# Patient Record
Sex: Female | Born: 1995 | Race: Black or African American | Hispanic: No | Marital: Single | Smoking: Former smoker
Health system: Southern US, Community
[De-identification: ages and names within clinical notes are randomized; demographics above are authoritative.]

---

## 2016-03-27 ENCOUNTER — Encounter (HOSPITAL_COMMUNITY): Payer: Self-pay | Admitting: Emergency Medicine

## 2016-03-27 ENCOUNTER — Ambulatory Visit (INDEPENDENT_AMBULATORY_CARE_PROVIDER_SITE_OTHER): Payer: Self-pay

## 2016-03-27 ENCOUNTER — Ambulatory Visit (HOSPITAL_COMMUNITY)
Admission: EM | Admit: 2016-03-27 | Discharge: 2016-03-27 | Disposition: A | Discharge status: Home / Self Care | Payer: Self-pay | Attending: Emergency Medicine | Admitting: Emergency Medicine

## 2016-03-27 DIAGNOSIS — S46911A Strain of unspecified muscle, fascia and tendon at shoulder and upper arm level, right arm, initial encounter: Secondary | ICD-10-CM

## 2016-03-27 DIAGNOSIS — S46001A Unspecified injury of muscle(s) and tendon(s) of the rotator cuff of right shoulder, initial encounter: Secondary | ICD-10-CM

## 2016-03-27 MED ORDER — TRAMADOL HCL 50 MG PO TABS: ORAL_TABLET | ORAL | 0 refills | Status: AC

## 2016-03-27 MED ORDER — PREDNISONE 10 MG (21) PO TBPK: ORAL_TABLET | ORAL | 0 refills | Status: AC

## 2016-03-27 MED ORDER — IBUPROFEN 800 MG PO TABS: 800.0000 mg | ORAL_TABLET | Freq: Three times a day (TID) | ORAL | 0 refills | Status: AC | PRN
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
## 2017-10-04 IMAGING — DX DG SHOULDER 2+V*L*
3 series · 3 of 3 positions shown · non-contrast
Comparison: None.

CLINICAL DATA: Left shoulder pain for 3 days after carrying heavy
trays

EXAM:
LEFT SHOULDER - 2+ VIEW

[shoulder ap]
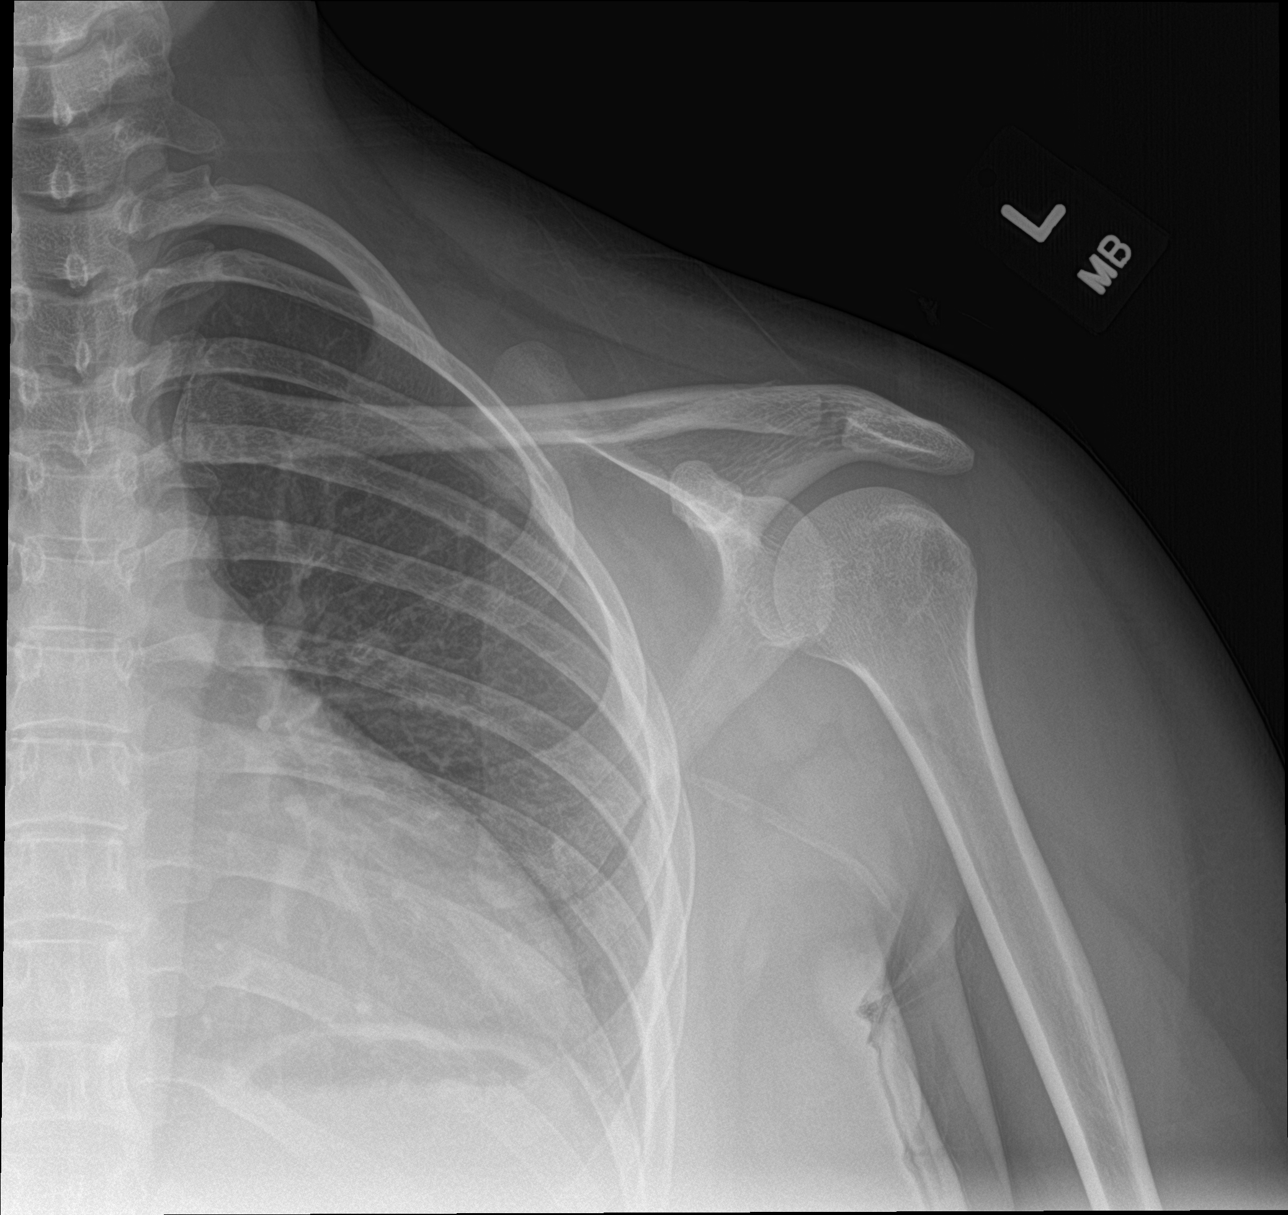

[shoulder grashey]
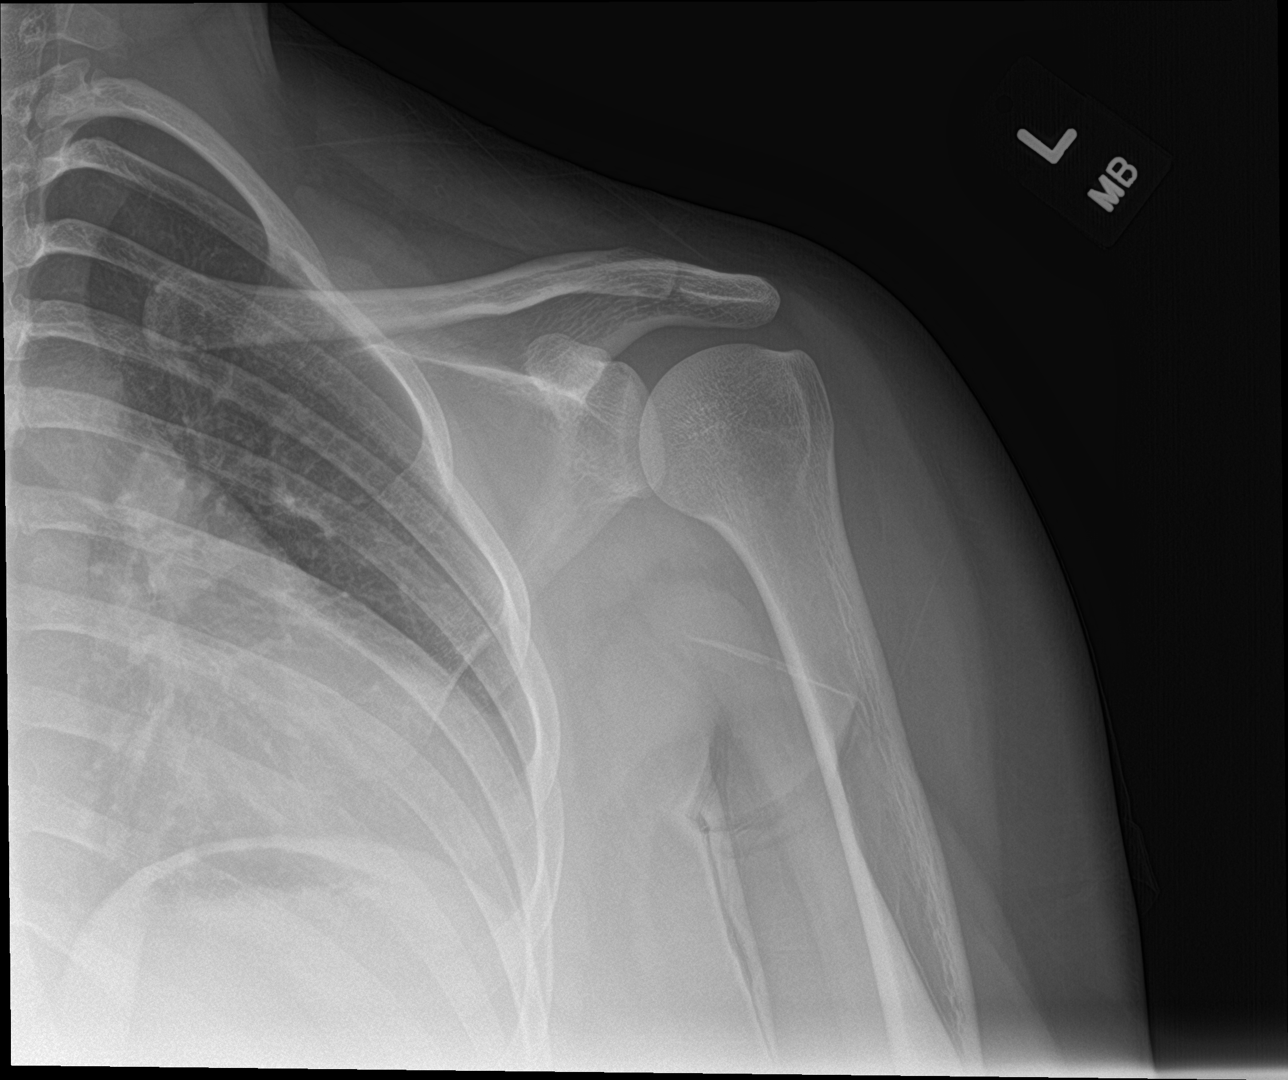

[shoulder y-view]
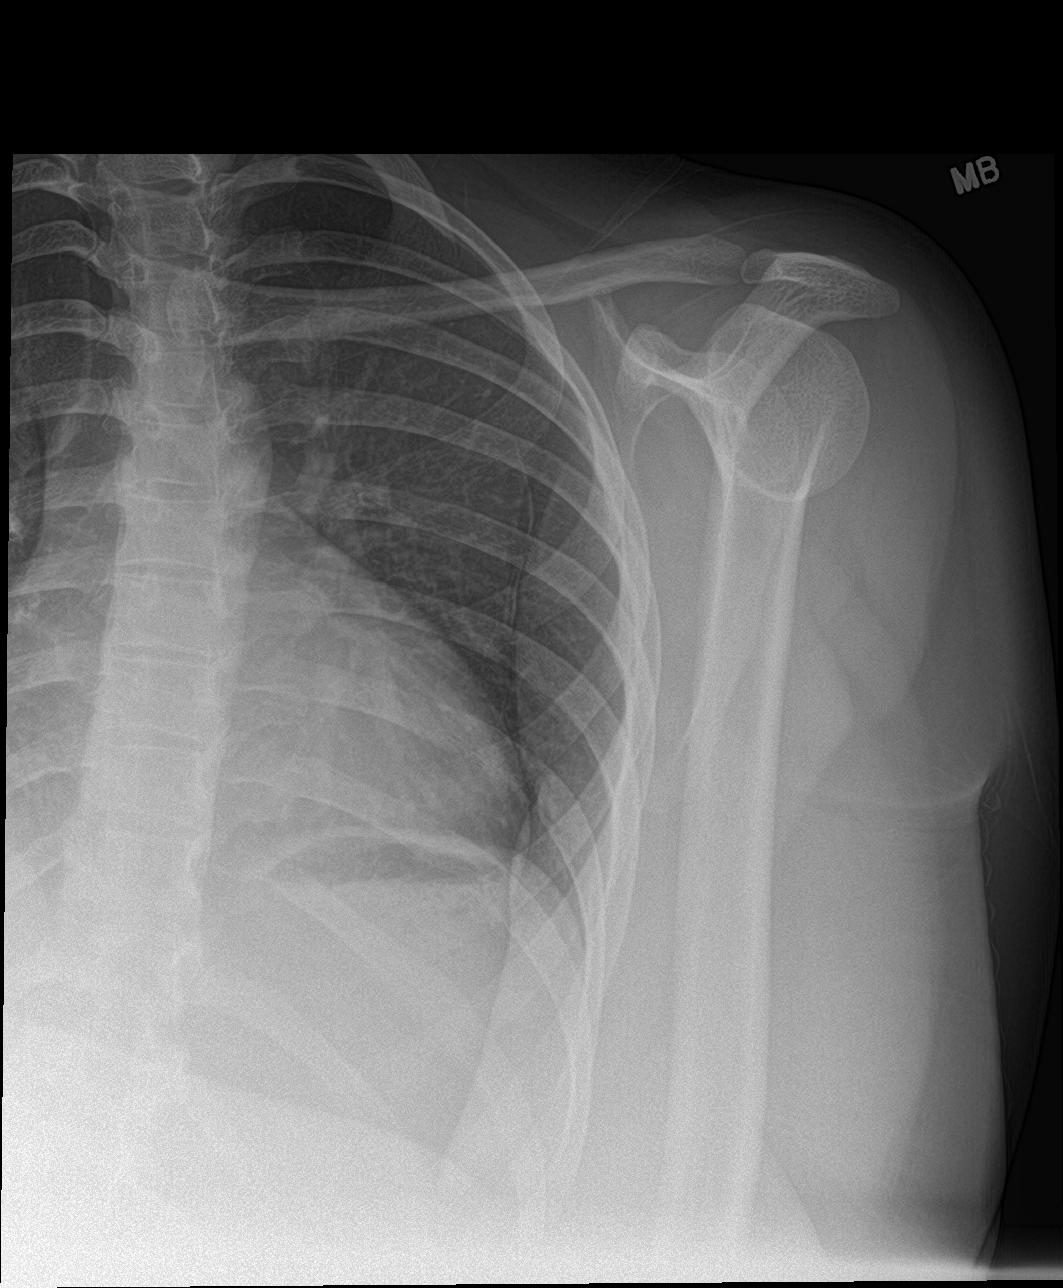

[3 of 3 positions shown; findings below may reference images not displayed]

FINDINGS: There is no evidence of fracture or dislocation. There is no
evidence of arthropathy or other focal bone abnormality. Soft
tissues are unremarkable.
IMPRESSION: Negative.
